# Patient Record
Sex: Female | Born: 1960 | Race: White | Hispanic: No | Marital: Married | Smoking: Never smoker
Health system: Southern US, Community
[De-identification: ages and names within clinical notes are randomized; demographics above are authoritative.]

## PROBLEM LIST (undated history)

## (undated) DIAGNOSIS — E079 Disorder of thyroid, unspecified: Secondary | ICD-10-CM

---

## 2002-10-15 ENCOUNTER — Other Ambulatory Visit: Admission: RE | Admit: 2002-10-15 | Discharge: 2002-10-15 | Payer: Self-pay | Admitting: Family Medicine

## 2002-10-29 ENCOUNTER — Encounter: Admission: RE | Admit: 2002-10-29 | Discharge: 2002-10-29 | Payer: Self-pay | Admitting: Family Medicine

## 2002-10-29 ENCOUNTER — Encounter: Payer: Self-pay | Admitting: Family Medicine

## 2004-11-22 ENCOUNTER — Other Ambulatory Visit: Admission: RE | Admit: 2004-11-22 | Discharge: 2004-12-05 | Payer: Self-pay | Admitting: Family Medicine

## 2004-12-12 ENCOUNTER — Encounter: Admission: RE | Admit: 2004-12-12 | Discharge: 2004-12-12 | Payer: Self-pay | Admitting: Family Medicine

## 2009-03-23 ENCOUNTER — Encounter: Admission: RE | Admit: 2009-03-23 | Discharge: 2009-03-23 | Payer: Self-pay | Admitting: Family Medicine

## 2009-03-30 ENCOUNTER — Encounter: Admission: RE | Admit: 2009-03-30 | Discharge: 2009-03-30 | Payer: Self-pay | Admitting: Family Medicine

## 2010-06-09 ENCOUNTER — Other Ambulatory Visit: Payer: Self-pay | Admitting: Family Medicine

## 2010-06-09 DIAGNOSIS — Z1231 Encounter for screening mammogram for malignant neoplasm of breast: Secondary | ICD-10-CM

## 2010-06-16 ENCOUNTER — Ambulatory Visit
Admission: RE | Admit: 2010-06-16 | Discharge: 2010-06-16 | Disposition: A | Discharge status: Home / Self Care | Payer: BC Managed Care – PPO | Source: Ambulatory Visit | Attending: Family Medicine | Admitting: Family Medicine

## 2010-06-16 DIAGNOSIS — Z1231 Encounter for screening mammogram for malignant neoplasm of breast: Secondary | ICD-10-CM

## 2011-01-30 ENCOUNTER — Other Ambulatory Visit: Payer: Self-pay | Admitting: Family Medicine

## 2011-03-21 ENCOUNTER — Ambulatory Visit: Payer: Self-pay | Admitting: Oncology

## 2011-03-27 ENCOUNTER — Encounter (HOSPITAL_COMMUNITY): Payer: Self-pay | Admitting: Emergency Medicine

## 2011-03-27 ENCOUNTER — Emergency Department (HOSPITAL_COMMUNITY): Payer: No Typology Code available for payment source

## 2011-03-27 ENCOUNTER — Emergency Department (HOSPITAL_COMMUNITY)
Admission: EM | Admit: 2011-03-27 | Discharge: 2011-03-27 | Disposition: A | Discharge status: Home / Self Care | Payer: No Typology Code available for payment source | Attending: Emergency Medicine | Admitting: Emergency Medicine

## 2011-03-27 DIAGNOSIS — Y92410 Unspecified street and highway as the place of occurrence of the external cause: Secondary | ICD-10-CM | POA: Insufficient documentation

## 2011-03-27 DIAGNOSIS — F29 Unspecified psychosis not due to a substance or known physiological condition: Secondary | ICD-10-CM | POA: Insufficient documentation

## 2011-03-27 DIAGNOSIS — N390 Urinary tract infection, site not specified: Secondary | ICD-10-CM

## 2011-03-27 DIAGNOSIS — R079 Chest pain, unspecified: Secondary | ICD-10-CM | POA: Insufficient documentation

## 2011-03-27 HISTORY — DX: Disorder of thyroid, unspecified: E07.9

## 2011-03-27 LAB — BASIC METABOLIC PANEL
Calcium: 9.5 mg/dL (ref 8.4–10.5)
GFR calc non Af Amer: >90 mL/min (ref >90)
Glucose, Bld: 126 mg/dL — ABNORMAL HIGH (ref 70–99)
Potassium: 3.9 mEq/L (ref 3.5–5.1)

## 2011-03-27 LAB — URINALYSIS, ROUTINE W REFLEX MICROSCOPIC
Bilirubin Urine: NEGATIVE
Nitrite: POSITIVE — AB
Protein, ur: NEGATIVE mg/dL
Specific Gravity, Urine: 1.013 (ref 1.005–1.030)
pH: 6 (ref 5.0–8.0)

## 2011-03-27 LAB — PROTIME-INR
INR: 0.92 (ref 0.00–1.49)
Prothrombin Time: 12.6 seconds (ref 11.6–15.2)

## 2011-03-27 LAB — CBC
HCT: 36.6 % (ref 36.0–46.0)
Hemoglobin: 12.3 g/dL (ref 12.0–15.0)
MCHC: 33.6 g/dL (ref 30.0–36.0)
MCV: 89.3 fL (ref 78.0–100.0)
Platelets: 250 10*3/uL (ref 150–400)
RDW: 13.3 % (ref 11.5–15.5)

## 2011-03-27 LAB — RAPID URINE DRUG SCREEN, HOSP PERFORMED: Tetrahydrocannabinol: NOT DETECTED

## 2011-03-27 LAB — URINE MICROSCOPIC-ADD ON

## 2011-03-27 MED ORDER — SODIUM CHLORIDE 0.9 % IV BOLUS (SEPSIS)
1000.0000 mL | Freq: Once | INTRAVENOUS | Status: AC
  Administered 2011-03-27: 1000 mL via INTRAVENOUS

## 2011-03-27 MED ORDER — CIPROFLOXACIN HCL 500 MG PO TABS: 500.0000 mg | ORAL_TABLET | Freq: Two times a day (BID) | ORAL | Status: AC
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
## 2011-03-27 NOTE — ED Notes (Signed)
Family at bedside. 

## 2011-03-27 NOTE — ED Notes (Signed)
Patient denies pain and is resting comfortably.  
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
## 2011-03-27 NOTE — ED Notes (Signed)
Vital signs stable. 

## 2011-03-27 NOTE — ED Notes (Signed)
Patient ambulated with a steady gait; A&Ox3; no signs of distress; respirations even and unlabored; skin warm and dry; no questions at this time.

## 2011-04-03 ENCOUNTER — Ambulatory Visit
Admission: RE | Admit: 2011-04-03 | Discharge: 2011-04-03 | Disposition: A | Discharge status: Home / Self Care | Payer: BC Managed Care – PPO | Source: Ambulatory Visit | Attending: Family Medicine | Admitting: Family Medicine

## 2011-04-19 ENCOUNTER — Observation Stay: Payer: Self-pay | Admitting: Neurology

## 2011-04-19 LAB — CBC WITH DIFFERENTIAL/PLATELET
Basophil %: 0.9 %
Eosinophil #: 0.1 10*3/uL (ref 0.0–0.7)
Eosinophil %: 1.2 %
HCT: 37.2 % (ref 35.0–47.0)
HGB: 12.4 g/dL (ref 12.0–16.0)
Lymphocyte %: 33.3 %
MCH: 30.1 pg (ref 26.0–34.0)
MCHC: 33.4 g/dL (ref 32.0–36.0)
Monocyte #: 0.3 10*3/uL (ref 0.0–0.7)
Neutrophil #: 2.5 10*3/uL (ref 1.4–6.5)
Neutrophil %: 57.4 %
RBC: 4.13 10*6/uL (ref 3.80–5.20)

## 2011-04-19 LAB — HCG, QUANTITATIVE, PREGNANCY: Beta Hcg, Quant.: 2 m[IU]/mL

## 2011-04-19 LAB — URINALYSIS, COMPLETE
Bacteria: NONE SEEN
Bilirubin,UR: NEGATIVE
Glucose,UR: NEGATIVE mg/dL (ref 0–75)
Ketone: NEGATIVE
RBC,UR: <1 /HPF (ref 0–5)
Specific Gravity: 1.01 (ref 1.003–1.030)
Squamous Epithelial: <1
WBC UR: 3 /HPF (ref 0–5)

## 2011-04-19 LAB — PROTIME-INR: Prothrombin Time: 12.2 secs (ref 11.5–14.7)

## 2011-04-19 LAB — BASIC METABOLIC PANEL
Anion Gap: 8 (ref 7–16)
BUN: 16 mg/dL (ref 7–18)
Calcium, Total: 9.4 mg/dL (ref 8.5–10.1)
EGFR (African American): >60
EGFR (Non-African Amer.): >60
Glucose: 95 mg/dL (ref 65–99)
Osmolality: 288 (ref 275–301)
Potassium: 4.9 mmol/L (ref 3.5–5.1)

## 2011-04-20 LAB — CEA: CEA: 1.1 ng/mL (ref 0.0–4.7)

## 2011-04-26 ENCOUNTER — Ambulatory Visit: Payer: Self-pay | Admitting: Oncology

## 2011-05-19 ENCOUNTER — Ambulatory Visit: Payer: Self-pay | Admitting: Oncology
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
## 2012-04-15 ENCOUNTER — Other Ambulatory Visit: Payer: Self-pay

## 2012-04-15 LAB — CBC WITH DIFFERENTIAL/PLATELET
Eosinophil %: 1.2 %
Lymphocyte #: 0.7 10*3/uL — ABNORMAL LOW (ref 1.0–3.6)
MCHC: 34.4 g/dL (ref 32.0–36.0)
MCV: 95 fL (ref 80–100)
Monocyte %: 7.3 %
Neutrophil #: 2.5 10*3/uL (ref 1.4–6.5)
Neutrophil %: 70.9 %
Platelet: 150 10*3/uL (ref 150–440)
RBC: 4.08 10*6/uL (ref 3.80–5.20)
WBC: 3.6 10*3/uL (ref 3.6–11.0)

## 2012-06-18 DEATH — deceased

## 2014-07-12 NOTE — Consult Note (Signed)
History of Present Illness:   Reason for Consult Multiple brain lesions, suspicious for metastatic disease.    HPI   Patient is a 54 year old female with no significant past medical history who presented with new onset seizures approximately one week ago.  She was placed on Keppra and has had no further seizures.  MRI the brain revealed multiple bilateral lesions highly suspicious for metastatic disease. Currently she feels well and is asymptomatic.  She denies any headaches or other neurologic complaints.  She has no recent fevers or illnesses.  She denies any sick contacts.  She has good appetite and denies weight loss.  She has no chest pain or shortness of breath.  She denies any nausea, vomiting, constipation, or diarrhea.  She has no urinary complaints.  She denies any pain.  Patient otherwise feels well and offers no specific complaints.  PFSH:   Additional Past Medical and Surgical History Past medical history: Hypothyroidism.  Past surgical history: D&C.  Social history: Denies tobacco or alcohol.  Family history: CVA, hypertension, CAD.  Maternal aunt with seizure disorder.   Review of Systems:   Performance Status (ECOG) 0    Review of Systems   As per HPI. Otherwise, 10 point system review was negative.   NURSING NOTES: **Vital Signs.:   30-Jan-13 11:58    Vital Signs Type: Admission    Temperature Temperature (F): 98.2    Celsius: 36.7    Temperature Source: oral    Pulse Pulse: 78    Pulse source: per Dinamap    Respirations Respirations: 20    Systolic BP Systolic BP: 875    Diastolic BP (mmHg) Diastolic BP (mmHg): 77    Mean BP: 90    BP Source: Dinamap    Pulse Ox % Pulse Ox %: 99    Pulse Ox Activity Level: At rest    Oxygen Delivery: Room Air/ 21 %   Physical Exam:   Physical Exam General: Well-developed, well-nourished, no acute distress. Eyes: Pink conjunctiva, anicteric sclera. HEENT: Normocephalic, moist mucous membranes, clear  oropharnyx. Lungs: Clear to auscultation bilaterally. Heart: Regular rate and rhythm. No rubs, murmurs, or gallops. Abdomen: Soft, nontender, nondistended. No organomegaly noted, normoactive bowel sounds. Musculoskeletal: No edema, cyanosis, or clubbing. Neuro: Alert, answering all questions appropriately. Cranial nerves grossly intact. Skin: No rashes or petechiae noted. Psych: Normal affect. Lymphatics: No cervical, calvicular, axillary or inguinal LAD.    No Known Allergies:     levetiracetam 750 mg oral tablet: 1 tab(s) orally 2 times a day, Active, 0, None   Synthroid 112 mcg (0.112 mg) oral tablet: 1 tab(s) orally once a day, Active, 0, None   fluticasone 50 mcg/inh nasal spray: 1 spray(s) nasal once a day, As Needed, Active, 0, None   Calcium 600+D:  orally 678m calcium and 800 IU vitamin D3, Active, 0, None  Routine Hem:  30-Jan-13 12:42    WBC (CBC) 4.4   RBC (CBC) 4.13   Hemoglobin (CBC) 12.4   Hematocrit (CBC) 37.2   Platelet Count (CBC) 238   MCV 90   MCH 30.1   MCHC 33.4   RDW 14.1   Neutrophil % 57.4   Lymphocyte % 33.3   Monocyte % 7.2   Eosinophil % 1.2   Basophil % 0.9   Neutrophil # 2.5   Lymphocyte # 1.5   Monocyte # 0.3   Eosinophil # 0.1   Basophil # 0.0  Routine Chem:  30-Jan-13 12:42    Glucose, Serum 95  BUN 16   Creatinine (comp) 0.59   Sodium, Serum 144   Potassium, Serum 4.9   Chloride, Serum 107   CO2, Serum 29   Calcium (Total), Serum 9.4   Anion Gap 8   Osmolality (calc) 288   eGFR (African American) >60   eGFR (Non-African American) >60  Routine Coag:  30-Jan-13 12:42    Prothrombin 12.2   INR 0.9   Activated PTT (APTT) 34.3   Assessment and Plan:  Impression:   Brain lesions, highly suspicious for metastatic disease.  Plan:   1.  Brain lesions: Unknown primary.  Patient had a CT scan of the chest, abdomen, and pelvis earlier today but did not reveal primary lesion.  Patient also reports she had a normal mammogram and  a normal colonoscopy within the past year.  Melanoma is a possibility and plan on doing a full skin exam tomorrow.  In the meantime, will get a PET scan for further evaluation.  Tumor markers are currently pending.  If no primary lesion can be identified, patient may require a brain biopsy for diagnosis. consult, will follow.  Electronic Signatures: Delight Hoh (MD)  (Signed 30-Jan-13 16:23)  Authored: HISTORY OF PRESENT ILLNESS, PFSH, ROS, NURSING NOTES, PE, ALLERGIES, HOME MEDICATIONS, LABS, ASSESSMENT AND PLAN   Last Updated: 30-Jan-13 16:23 by Delight Hoh (MD)
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
## 2014-07-12 NOTE — H&P (Signed)
PATIENT NAME:  [name], Haifa S MR#:  [id number] DATE OF BIRTH:  1961/03/17  DATE OF ADMISSION:  04/19/2011  BRIEF ADMISSION NOTE: Ms. Priscilla Brewer is a 54 year old right-handed married white patient of Dr. Burnell Bl[place]aura [place] who was seen for neurologic evaluation last week on 04/14/2011 due to recent problems with spells. She was suspected to have the onset of new seizures. She was started on Keppra 750 mg twice a day. She was scheduled for testing including brain MRI scan.  A copy of her 04/14/2011 office note, detailing her history and examination, is attached to her hospital chart.  She underwent brain MRI scan without and with contrast today. Her scan was abnormal with areas of altered signal intensity with appearance most suggestive of tumor metastases, one of the high right frontoparietal region and one of the left posterior parietal area. Both would appear to be centered at the gray-white junction.   She is admitted for evaluation and treatment of suspected metastatic lesions to the brain from unknown primary. She will be continued on Keppra 70 mg twice a day for control of seizures. She reports no problems with any side effects of starting Keppra. She has not developed problems with headache or nausea, malaise, or any difficulty with concentration. Therefore, I have held for now on having her begin on Decadron. I have spoken with Dr. Orlie DakinFinnegan of oncology who will see her later today. At his recommendation, she has been ordered to have CT scan without and with contrast of the chest and abdomen and pelvis today. She will continue on her regular dose of Synthroid at 112 mcg a day. I have spoken with the patient's husband and apprised him of her MRI scans findings and the plan to have her admitted for expeditious metastatic work-up and treatment as needed. I will be handing over the baton to the oncology service. I have also put in a request for evaluation with Dr. Ian MalkinGlen Chrystal of  radiation oncology. I will plan to be following her course in the hospital.   ____________________________ Rose PhiPeter R. Kemper Durielarke, MD prc:cms D: 04/19/2011 13:24:55 ET T: 04/19/2011 14:03:02 ET JOB#: [id number]  cc: Rose PhiPeter R. Kemper Durielarke, MD, <Dictator> Maura L. Hamrick, MD Gaspar GarbePETER R Alex Leahy MD ELECTRONICALLY SIGNED 04/19/2011 18:33
